# Patient Record
Sex: Female | Born: 1970 | Race: White | Hispanic: No | Marital: Single | Smoking: Current every day smoker
Health system: Southern US, Community
[De-identification: ages and names within clinical notes are randomized; demographics above are authoritative.]

## PROBLEM LIST (undated history)

## (undated) ENCOUNTER — Emergency Department (HOSPITAL_BASED_OUTPATIENT_CLINIC_OR_DEPARTMENT_OTHER): Discharge status: Against Medical Advice | Payer: 59

## (undated) DIAGNOSIS — F419 Anxiety disorder, unspecified: Secondary | ICD-10-CM

## (undated) DIAGNOSIS — M542 Cervicalgia: Secondary | ICD-10-CM

## (undated) DIAGNOSIS — M76892 Other specified enthesopathies of left lower limb, excluding foot: Secondary | ICD-10-CM

## (undated) HISTORY — PX: OTHER SURGICAL HISTORY: SHX169

## (undated) HISTORY — PX: BREAST REDUCTION SURGERY: SHX8

---

## 1998-07-26 ENCOUNTER — Other Ambulatory Visit: Admission: RE | Admit: 1998-07-26 | Discharge: 1998-07-26 | Payer: Self-pay | Admitting: Family Medicine

## 2000-01-09 ENCOUNTER — Other Ambulatory Visit: Admission: RE | Admit: 2000-01-09 | Discharge: 2000-01-09 | Payer: Self-pay | Admitting: Emergency Medicine

## 2012-03-31 ENCOUNTER — Encounter (HOSPITAL_BASED_OUTPATIENT_CLINIC_OR_DEPARTMENT_OTHER): Payer: Self-pay | Admitting: *Deleted

## 2012-03-31 ENCOUNTER — Emergency Department (HOSPITAL_BASED_OUTPATIENT_CLINIC_OR_DEPARTMENT_OTHER)
Admission: EM | Admit: 2012-03-31 | Discharge: 2012-04-01 | Disposition: A | Discharge status: Home / Self Care | Payer: 59 | Attending: Emergency Medicine | Admitting: Emergency Medicine

## 2012-03-31 ENCOUNTER — Emergency Department (HOSPITAL_BASED_OUTPATIENT_CLINIC_OR_DEPARTMENT_OTHER): Payer: 59

## 2012-03-31 DIAGNOSIS — IMO0001 Reserved for inherently not codable concepts without codable children: Secondary | ICD-10-CM | POA: Insufficient documentation

## 2012-03-31 DIAGNOSIS — S61209A Unspecified open wound of unspecified finger without damage to nail, initial encounter: Secondary | ICD-10-CM | POA: Insufficient documentation

## 2012-03-31 DIAGNOSIS — Y92009 Unspecified place in unspecified non-institutional (private) residence as the place of occurrence of the external cause: Secondary | ICD-10-CM | POA: Insufficient documentation

## 2012-03-31 DIAGNOSIS — L0390 Cellulitis, unspecified: Secondary | ICD-10-CM

## 2012-03-31 DIAGNOSIS — W5501XA Bitten by cat, initial encounter: Secondary | ICD-10-CM

## 2012-03-31 DIAGNOSIS — L02519 Cutaneous abscess of unspecified hand: Secondary | ICD-10-CM | POA: Insufficient documentation

## 2012-03-31 DIAGNOSIS — L03019 Cellulitis of unspecified finger: Secondary | ICD-10-CM | POA: Insufficient documentation

## 2012-03-31 HISTORY — DX: Anxiety disorder, unspecified: F41.9

## 2012-03-31 HISTORY — DX: Cervicalgia: M54.2

## 2012-03-31 MED ORDER — SODIUM CHLORIDE 0.9 % IV SOLN
3.0000 g | Freq: Once | INTRAVENOUS | Status: AC
  Administered 2012-04-01: 3 g via INTRAVENOUS
  Filled 2012-03-31: qty 3

## 2012-03-31 MED ORDER — TETANUS-DIPHTH-ACELL PERTUSSIS 5-2.5-18.5 LF-MCG/0.5 IM SUSP
0.5000 mL | Freq: Once | INTRAMUSCULAR | Status: AC
  Administered 2012-04-01: 0.5 mL via INTRAMUSCULAR
  Filled 2012-03-31: qty 0.5

## 2012-03-31 NOTE — ED Notes (Signed)
Pt reports she was bitten by a cat last night on right hand- swelling and red streaks noted

## 2012-03-31 NOTE — ED Provider Notes (Addendum)
History   This chart was scribed for Alexa Bucco, MD by Shari Heritage. The patient was seen in room MH08/MH08. Patient's care was started at 2233.     CSN: [id number]  Arrival date & time 03/31/12  2233   First MD Initiated Contact with Patient 03/31/12 2324      Chief Complaint  Patient presents with  . Animal Bite    (Consider location/radiation/quality/duration/timing/severity/associated sxs/prior treatment) The history is provided by the patient. No language interpreter was used.   Likisha Hughes is a 41 y.o. female who presents to the Emergency Department complaining of an animal bite to the right hand that occurred last night with associated redness and swelling. Today, the redness started spreading up her arm.  Patient's friend's cat bit her, and she says that the cat is an indoor cat and it's vaccines are UTD.  Patient is right-handed. Patient said she applied hydrogen peroxide to the bite. Patient took Ibuprofen this morning. Patient thinks she had a tetanus shot in 2007. Patient denies fever, nausea, vomiting, cough, abdominal pain. Patient denies any other symptoms. Patient with h/o of neck pain, anxiety, and arm surgery.   Past Medical History  Diagnosis Date  . Neck pain   . Anxiety     Past Surgical History  Procedure Date  . Breast reduction surgery   . Cesarean section   . Arm surgery     No family history on file.  History  Substance Use Topics  . Smoking status: Current Everyday Smoker  . Smokeless tobacco: Never Used  . Alcohol Use: Not on file    OB History    Grav Para Term Preterm Abortions TAB SAB Ect Mult Living                  Review of Systems A complete 10 system review of systems was obtained and all systems are negative except as noted in the HPI and PMH.   Allergies  Review of patient's allergies indicates no known allergies.  Home Medications   Current Outpatient Rx  Name Route Sig Dispense Refill  . FLUOXETINE HCL 20 MG PO  CAPS Oral Take 20 mg by mouth daily.    . IBUPROFEN 200 MG PO TABS Oral Take 400 mg by mouth every 6 (six) hours as needed. Patient used this medication for pain.    Marland Kitchen OMEPRAZOLE 40 MG PO CPDR Oral Take 40 mg by mouth daily.    . AMOXICILLIN-POT CLAVULANATE 875-125 MG PO TABS Oral Take 1 tablet by mouth every 12 (twelve) hours. 14 tablet 0  . HYDROCODONE-ACETAMINOPHEN 5-500 MG PO TABS Oral Take 1-2 tablets by mouth every 6 (six) hours as needed for pain. 15 tablet 0    BP 141/82  Pulse 81  Temp(Src) 98.2 F (36.8 C) (Oral)  Resp 18  Ht 5\' 2"  (1.575 m)  Wt 146 lb (66.225 kg)  BMI 26.70 kg/m2  SpO2 100%  LMP 03/27/2012  Physical Exam  Nursing note and vitals reviewed. Constitutional: She is oriented to person, place, and time. She appears well-developed and well-nourished.  HENT:  Head: Normocephalic and atraumatic.  Eyes: Conjunctivae and EOM are normal. Pupils are equal, round, and reactive to light.  Neck: Normal range of motion. Neck supple.  Cardiovascular: Normal rate and regular rhythm.   Pulmonary/Chest: Effort normal and breath sounds normal.  Abdominal: Soft. Bowel sounds are normal.  Musculoskeletal: Normal range of motion.       Diffuse swelling to right thumb with erythema.  Red streak extending to mid-forearm. No induration or fluctuance. No pain with passive ROM in thumb. No drainage.  Neurological: She is alert and oriented to person, place, and time.  Skin: Skin is warm and dry.  Psychiatric: She has a normal mood and affect.    ED Course  Procedures (including critical care time) DIAGNOSTIC STUDIES: Oxygen Saturation is 100% on room air, normal by my interpretation.    COORDINATION OF CARE: 11:38PM-  Patient informed of current plan for treatment and evaluation and agrees with plan at this time.  Will order X-Ray of hand. Will administer IV antibiotics and Ibuprofen in ED. Will prescribe oral antibiotics. Will administer TDAP vaccine.  Labs Reviewed - No  data to display Dg Finger Thumb Right  04/01/2012  *RADIOLOGY REPORT*  Clinical Data: Thumb bite by cat.  Thumb pain and swelling.  RIGHT THUMB 2+V  Comparison:  None.  Findings:  There is no evidence of fracture or dislocation.  There is no evidence of arthropathy or other focal bone abnormality. Soft tissues are unremarkable.  IMPRESSION:  Negative.  Original Report Authenticated By: Danae Orleans, M.D.     1. Cat bite   2. Cellulitis       MDM  Pt with cellulitis related to cat bite to thumb.  No evidence of abscess or tenosynovitis.  Given dose of IV abx, TDAP updated.  Animal vaccines UTD.  Will bring pt back tomorrow for a recheck.      I personally performed the services described in this documentation, which was scribed in my presence.  The recorded information has been reviewed and considered.    Alexa Bucco, MD 04/01/12 1610  Alexa Bucco, MD 04/01/12 [phone]

## 2012-04-01 MED ORDER — HYDROCODONE-ACETAMINOPHEN 5-500 MG PO TABS: 1.0000 | ORAL_TABLET | Freq: Four times a day (QID) | ORAL | Status: AC | PRN

## 2012-04-01 MED ORDER — FLUCONAZOLE 200 MG PO TABS: 200.0000 mg | ORAL_TABLET | Freq: Every day | ORAL | Status: AC

## 2012-04-01 MED ORDER — AMOXICILLIN-POT CLAVULANATE 875-125 MG PO TABS: 1.0000 | ORAL_TABLET | Freq: Two times a day (BID) | ORAL | Status: AC

## 2012-04-01 MED ORDER — IBUPROFEN 800 MG PO TABS
800.0000 mg | ORAL_TABLET | Freq: Once | ORAL | Status: AC
  Administered 2012-04-01: 800 mg via ORAL
  Filled 2012-04-01: qty 1

## 2012-04-01 NOTE — Discharge Instructions (Signed)
Cellulitis  Cellulitis is an infection of the skin and the tissue beneath it. The area is typically red and tender. It is caused by germs (bacteria) (usually staph or strep) that enter the body through cuts or sores. Cellulitis most commonly occurs in the arms or lower legs.   HOME CARE INSTRUCTIONS    If you are given a prescription for medications which kill germs (antibiotics), take as directed until finished.   If the infection is on the arm or leg, keep the limb elevated as able.   Use a warm cloth several times per day to relieve pain and encourage healing.   See your caregiver for recheck of the infected site as directed if problems arise.   Only take over-the-counter or prescription medicines for pain, discomfort, or fever as directed by your caregiver.  SEEK MEDICAL CARE IF:    The area of redness (inflammation) is spreading, there are red streaks coming from the infected site, or if a part of the infection begins to turn dark in color.   The joint or bone underneath the infected skin becomes painful after the skin has healed.   The infection returns in the same or another area after it seems to have gone away.   A boil or bump swells up. This may be an abscess.   New, unexplained problems such as pain or fever develop.  SEEK IMMEDIATE MEDICAL CARE IF:    You have a fever.   You or your child feels drowsy or lethargic.   There is vomiting, diarrhea, or lasting discomfort or feeling ill (malaise) with muscle aches and pains.  MAKE SURE YOU:    Understand these instructions.   Will watch your condition.   Will get help right away if you are not doing well or get worse.  Document Released: 08/03/2005 Document Revised: 10/13/2011 Document Reviewed: 06/11/2008  [employer] Patient Information 2012 [employer]  Animal Bite  An animal bite can result in a scratch on the skin, deep open cut, puncture of the skin, crush injury, or tearing away of the skin or a body part. Dogs are responsible for  most animal bites. Children are bitten more often than adults. An animal bite can range from very mild to more serious. A small bite from your house pet is no cause for alarm. However, some animal bites can become infected or injure a bone or other tissue. You must seek medical care if:   The skin is broken and bleeding does not slow down or stop after 15 minutes.   The puncture is deep and difficult to clean (such as a cat bite).   Pain, warmth, redness, or pus develops around the wound.   The bite is from a stray animal or rodent. There may be a risk of rabies infection.   The bite is from a snake, raccoon, skunk, fox, coyote, or bat. There may be a risk of rabies infection.   The person bitten has a chronic illness such as diabetes, liver disease, or cancer, or the person takes medicine that lowers the immune system.   There is concern about the location and severity of the bite.  It is important to clean and protect an animal bite wound right away to prevent infection. Follow these steps:   Clean the wound with plenty of water and soap.   Apply an antibiotic cream.   Apply gentle pressure over the wound with a clean towel or gauze to slow or stop bleeding.     Elevate the affected area above the heart to help stop any bleeding.   Seek medical care. Getting medical care within 8 hours of the animal bite leads to the best possible outcome.  DIAGNOSIS   Your caregiver will most likely:   Take a detailed history of the animal and the bite injury.   Perform a wound exam.   Take your medical history.  Blood tests or X-rays may be performed. Sometimes, infected bite wounds are cultured and sent to a lab to identify the infectious bacteria.   TREATMENT   Medical treatment will depend on the location and type of animal bite as well as the patient's medical history. Treatment may include:   Wound care, such as cleaning and flushing the wound with saline solution, bandaging, and elevating the affected  area.   Antibiotics.   Tetanus immunization.   Rabies immunization.   Leaving the wound open to heal. This is often done with animal bites, due to the high risk of infection. However, in certain cases, wound closure with stitches, wound adhesive, skin adhesive strips, or staples may be used.  Infected bites that are left untreated may require intravenous (IV) antibiotics and surgical treatment in the hospital.  HOME CARE INSTRUCTIONS   Follow your caregiver's instructions for wound care.   Take all medicines as directed.   If your caregiver prescribes antibiotics, take them as directed. Finish them even if you start to feel better.   Follow up with your caregiver for further exams or immunizations as directed.  You may need a tetanus shot if:   You cannot remember when you had your last tetanus shot.   You have never had a tetanus shot.   The injury broke your skin.  If you get a tetanus shot, your arm may swell, get red, and feel warm to the touch. This is common and not a problem. If you need a tetanus shot and you choose not to have one, there is a rare chance of getting tetanus. Sickness from tetanus can be serious.  SEEK MEDICAL CARE IF:   You notice warmth, redness, soreness, swelling, pus discharge, or a bad smell coming from the wound.   You have a red line on the skin coming from the wound.   You have a fever, chills, or a general ill feeling.   You have nausea or vomiting.   You have continued or worsening pain.   You have trouble moving the injured part.   You have other questions or concerns.  MAKE SURE YOU:   Understand these instructions.   Will watch your condition.   Will get help right away if you are not doing well or get worse.  Document Released: 07/12/2011 Document Revised: 10/13/2011 Document Reviewed: 07/12/2011  [employer] Patient Information 2012 [employer]

## 2012-04-01 NOTE — ED Notes (Signed)
rx x 1 given for hydrocodone- rx x 2 for augmentin and dilflucan e-prescribed

## 2012-05-11 ENCOUNTER — Emergency Department (HOSPITAL_COMMUNITY)
Admission: EM | Admit: 2012-05-11 | Discharge: 2012-05-11 | Disposition: A | Discharge status: Home / Self Care | Payer: 59 | Attending: Emergency Medicine | Admitting: Emergency Medicine

## 2012-05-11 ENCOUNTER — Encounter (HOSPITAL_COMMUNITY): Payer: Self-pay | Admitting: Emergency Medicine

## 2012-05-11 DIAGNOSIS — F411 Generalized anxiety disorder: Secondary | ICD-10-CM | POA: Insufficient documentation

## 2012-05-11 DIAGNOSIS — F17200 Nicotine dependence, unspecified, uncomplicated: Secondary | ICD-10-CM | POA: Insufficient documentation

## 2012-05-11 DIAGNOSIS — X58XXXA Exposure to other specified factors, initial encounter: Secondary | ICD-10-CM | POA: Insufficient documentation

## 2012-05-11 DIAGNOSIS — S8990XA Unspecified injury of unspecified lower leg, initial encounter: Secondary | ICD-10-CM | POA: Insufficient documentation

## 2012-05-11 HISTORY — DX: Other specified enthesopathies of left lower limb, excluding foot: M76.892

## 2012-05-11 MED ORDER — KETOROLAC TROMETHAMINE 30 MG/ML IJ SOLN: 30.0000 mg | Freq: Once | INTRAMUSCULAR | Status: DC

## 2012-05-11 MED ORDER — OXYCODONE-ACETAMINOPHEN 5-325 MG PO TABS: 2.0000 | ORAL_TABLET | ORAL | Status: AC | PRN

## 2012-05-11 NOTE — ED Provider Notes (Signed)
History     CSN: [id number]  Arrival date & time 05/11/12  0913   First MD Initiated Contact with Patient 05/11/12 [phone]      Chief Complaint  Patient presents with  . Knee Pain    (Consider location/radiation/quality/duration/timing/severity/associated sxs/prior treatment) Patient is a 41 y.o. female presenting with knee pain. The history is provided by the patient.  Knee Pain  pt was looking under the bed and felt and heard a "pop" in her left knee.  She did NOT fall or have trauma.   She denies pain or prior left knee injury.  She has no other complaints.  Past Medical History  Diagnosis Date  . Neck pain   . Anxiety   . Tendonitis of knee, left     Past Surgical History  Procedure Date  . Breast reduction surgery   . Cesarean section   . Arm surgery     No family history on file.  History  Substance Use Topics  . Smoking status: Current Everyday Smoker  . Smokeless tobacco: Never Used  . Alcohol Use: 1.8 oz/week    3 Glasses of wine per week    OB History    Grav Para Term Preterm Abortions TAB SAB Ect Mult Living                  Review of Systems  Musculoskeletal:       Left knee pain  Skin: Negative for wound.  Neurological: Negative for weakness.  Hematological: Does not bruise/bleed easily.  All other systems reviewed and are negative.    Allergies  Review of patient's allergies indicates no known allergies.  Home Medications   Current Outpatient Rx  Name Route Sig Dispense Refill  . FLUOXETINE HCL 20 MG PO CAPS Oral Take 20 mg by mouth daily.    . IBUPROFEN 200 MG PO TABS Oral Take 400 mg by mouth every 6 (six) hours as needed. For pain    . OMEPRAZOLE 40 MG PO CPDR Oral Take 40 mg by mouth daily.    . OXYCODONE-ACETAMINOPHEN 5-325 MG PO TABS Oral Take 2 tablets by mouth every 4 (four) hours as needed for pain. 15 tablet 0    BP 127/72  Pulse 62  Temp 98.7 F (37.1 C) (Oral)  Resp 18  SpO2 96%  LMP 05/02/2012  Physical Exam    Nursing note and vitals reviewed. Constitutional: She is oriented to person, place, and time. She appears well-developed and well-nourished.  HENT:  Head: Normocephalic and atraumatic.  Eyes: EOM are normal.  Neck: Normal range of motion.  Pulmonary/Chest: Effort normal.  Abdominal: She exhibits no distension.  Musculoskeletal: Normal range of motion. She exhibits tenderness. She exhibits no edema.       Left knee No deformity, edema, ecchymoses + ttp over medial knee overlying MCL Neg ant drawer or sag Joint does not open with medial or lateral stress.   From both active and passively Pt is able to extend knee on her own.  Neurological: She is alert and oriented to person, place, and time.  Skin: Skin is warm and dry.  Psychiatric: She has a normal mood and affect. Thought content normal.    ED Course  Procedures (including critical care time)  Labs Reviewed - No data to display No results found.   1. Knee injury       MDM  Medial left knee pain Probable MCL strain.  No internal ligamentous injury. No direct trauma so no  fx or dislocation        Cheri Guppy, MD 05/11/12 1017

## 2012-05-11 NOTE — ED Notes (Signed)
[id number]<AV> Expected date:05/11/12<BR> Expected time: 8:51 AM<BR> Means of arrival:Ambulance<BR> Comments:<BR> M11,40yoF, knee pain

## 2012-05-11 NOTE — ED Notes (Signed)
Quentin from ortho notified regarding pt knee immobilizer.

## 2012-05-11 NOTE — ED Notes (Signed)
Per EMS, pt was working this am, looking under a bed and twisted her knee. Per EMS pt reports feeling something "pop".

## 2012-05-11 NOTE — ED Notes (Addendum)
Pt states she was on her knees, knelling down and she twisted her left knee this am, while at work.  States pain is 8/10 during movement.

## 2019-10-02 ENCOUNTER — Emergency Department (HOSPITAL_COMMUNITY)
Admission: EM | Admit: 2019-10-02 | Discharge: 2019-10-03 | Disposition: A | Discharge status: Home / Self Care | Payer: 59 | Attending: Emergency Medicine | Admitting: Emergency Medicine

## 2019-10-02 ENCOUNTER — Other Ambulatory Visit: Payer: Self-pay

## 2019-10-02 ENCOUNTER — Encounter (HOSPITAL_COMMUNITY): Payer: Self-pay | Admitting: Emergency Medicine

## 2019-10-02 ENCOUNTER — Emergency Department (HOSPITAL_COMMUNITY): Payer: 59

## 2019-10-02 DIAGNOSIS — Z20828 Contact with and (suspected) exposure to other viral communicable diseases: Secondary | ICD-10-CM | POA: Insufficient documentation

## 2019-10-02 DIAGNOSIS — Z79899 Other long term (current) drug therapy: Secondary | ICD-10-CM | POA: Diagnosis not present

## 2019-10-02 DIAGNOSIS — F17200 Nicotine dependence, unspecified, uncomplicated: Secondary | ICD-10-CM | POA: Diagnosis not present

## 2019-10-02 DIAGNOSIS — H9311 Tinnitus, right ear: Secondary | ICD-10-CM | POA: Diagnosis not present

## 2019-10-02 DIAGNOSIS — R42 Dizziness and giddiness: Secondary | ICD-10-CM | POA: Diagnosis not present

## 2019-10-02 DIAGNOSIS — R519 Headache, unspecified: Secondary | ICD-10-CM | POA: Diagnosis not present

## 2019-10-02 DIAGNOSIS — R209 Unspecified disturbances of skin sensation: Secondary | ICD-10-CM | POA: Diagnosis not present

## 2019-10-02 DIAGNOSIS — R202 Paresthesia of skin: Secondary | ICD-10-CM | POA: Insufficient documentation

## 2019-10-02 LAB — CBC
HCT: 42.1 % (ref 36.0–46.0)
Hemoglobin: 14.2 g/dL (ref 12.0–15.0)
MCH: 30.5 pg (ref 26.0–34.0)
MCHC: 33.7 g/dL (ref 30.0–36.0)
MCV: 90.3 fL (ref 80.0–100.0)
Platelets: 270 10*3/uL (ref 150–400)
RBC: 4.66 MIL/uL (ref 3.87–5.11)
RDW: 12.3 % (ref 11.5–15.5)
WBC: 7.1 10*3/uL (ref 4.0–10.5)
nRBC: 0 % (ref 0.0–0.2)

## 2019-10-02 LAB — I-STAT BETA HCG BLOOD, ED (MC, WL, AP ONLY): I-stat hCG, quantitative: <5 m[IU]/mL (ref <5)

## 2019-10-02 LAB — URINALYSIS, ROUTINE W REFLEX MICROSCOPIC
Bilirubin Urine: NEGATIVE
Glucose, UA: NEGATIVE mg/dL
Hgb urine dipstick: NEGATIVE
Ketones, ur: NEGATIVE mg/dL
Leukocytes,Ua: NEGATIVE
Nitrite: NEGATIVE
Protein, ur: NEGATIVE mg/dL
Specific Gravity, Urine: 1.003 — ABNORMAL LOW (ref 1.005–1.030)
pH: 6 (ref 5.0–8.0)

## 2019-10-02 LAB — BASIC METABOLIC PANEL
Anion gap: 8 (ref 5–15)
BUN: 8 mg/dL (ref 6–20)
CO2: 20 mmol/L — ABNORMAL LOW (ref 22–32)
Calcium: 8.5 mg/dL — ABNORMAL LOW (ref 8.9–10.3)
Chloride: 112 mmol/L — ABNORMAL HIGH (ref 98–111)
Creatinine, Ser: 0.71 mg/dL (ref 0.44–1.00)
GFR calc Af Amer: >60 mL/min (ref >60)
GFR calc non Af Amer: >60 mL/min (ref >60)
Glucose, Bld: 81 mg/dL (ref 70–99)
Potassium: 3.8 mmol/L (ref 3.5–5.1)
Sodium: 140 mmol/L (ref 135–145)

## 2019-10-02 LAB — LIPASE, BLOOD: Lipase: 34 U/L (ref 11–51)

## 2019-10-02 MED ORDER — IOHEXOL 350 MG/ML SOLN
80.0000 mL | Freq: Once | INTRAVENOUS | Status: AC | PRN
  Administered 2019-10-02: 80 mL via INTRAVENOUS

## 2019-10-02 MED ORDER — SODIUM CHLORIDE 0.9 % IV BOLUS
1000.0000 mL | Freq: Once | INTRAVENOUS | Status: AC
  Administered 2019-10-02: 21:00:00 1000 mL via INTRAVENOUS

## 2019-10-02 MED ORDER — ONDANSETRON 4 MG PO TBDP
4.0000 mg | ORAL_TABLET | Freq: Once | ORAL | Status: AC
  Administered 2019-10-02: 17:00:00 4 mg via ORAL
  Filled 2019-10-02: qty 1

## 2019-10-02 MED ORDER — MECLIZINE HCL 25 MG PO TABS
25.0000 mg | ORAL_TABLET | Freq: Once | ORAL | Status: AC
  Administered 2019-10-02: 21:00:00 25 mg via ORAL
  Filled 2019-10-02: qty 1

## 2019-10-02 NOTE — ED Provider Notes (Signed)
11:24 PM Assumed care from Dr. Billy Fischer, please see their note for full history, physical and decision making until this point. In brief this is a 48 y.o. year old female who presented to the ED tonight with Dizziness and Nausea     Some mild dizziness/diarrhea then into what sounds like vertigo (peripheral). Then bue paresthesias and some intermittent blurry vision.   Discussed with Neurology. Pending cta and mri. Discharge on meclizine if no cva. If arterial abnormality then 81 mg ASA and follow up.   Significant MRI delay 2/2 pending covid test. Ultimately negative. Plan for dc on meclizine w/ pcp follow up  Discharge instructions, including strict return precautions for new or worsening symptoms, given. Patient and/or family verbalized understanding and agreement with the plan as described.   Labs, studies and imaging reviewed by myself and considered in medical decision making if ordered. Imaging interpreted by radiology.  Labs Reviewed  BASIC METABOLIC PANEL - Abnormal; Notable for the following components:      Result Value   Chloride 112 (*)    CO2 20 (*)    Calcium 8.5 (*)    All other components within normal limits  URINALYSIS, ROUTINE W REFLEX MICROSCOPIC - Abnormal; Notable for the following components:   Color, Urine STRAW (*)    Specific Gravity, Urine 1.003 (*)    All other components within normal limits  SARS CORONAVIRUS 2 (TAT 6-24 HRS)  CBC  LIPASE, BLOOD  CBG MONITORING, ED  I-STAT BETA HCG BLOOD, ED ([facility], [facility], [facility] ONLY)    CT Head Wo Contrast  Final Result    MR BRAIN WO CONTRAST    (Results Pending)  CT Angio Neck W and/or Wo Contrast    (Results Pending)  CT Angio Head W or Wo Contrast    (Results Pending)    No follow-ups on file.    Laikyn Gewirtz, Corene Cornea, MD 10/03/19 [phone]

## 2019-10-02 NOTE — ED Notes (Signed)
Pt reports intermittent left and right arm numbness. Equal grip strength and no arm drift noted.
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
## 2019-10-02 NOTE — ED Notes (Signed)
CBG 168 @1655 

## 2019-10-02 NOTE — ED Notes (Signed)
Patient transported to CT 

## 2019-10-02 NOTE — ED Triage Notes (Signed)
Pt arrives via EMS from work with reports of lightheadedness, nausea and diarrhea starting today. Reports when she closes her eyes, the room spins.

## 2019-10-03 ENCOUNTER — Emergency Department (HOSPITAL_COMMUNITY): Payer: 59

## 2019-10-03 LAB — SARS CORONAVIRUS 2 (TAT 6-24 HRS): SARS Coronavirus 2: NEGATIVE

## 2019-10-03 MED ORDER — MECLIZINE HCL 25 MG PO TABS: 25.0000 mg | ORAL_TABLET | Freq: Three times a day (TID) | ORAL | 0 refills | Status: AC | PRN

## 2019-10-03 NOTE — ED Notes (Signed)
Returned from MRI 

## 2019-12-23 ENCOUNTER — Emergency Department (HOSPITAL_BASED_OUTPATIENT_CLINIC_OR_DEPARTMENT_OTHER): Payer: No Typology Code available for payment source

## 2019-12-23 ENCOUNTER — Encounter (HOSPITAL_BASED_OUTPATIENT_CLINIC_OR_DEPARTMENT_OTHER): Payer: Self-pay | Admitting: *Deleted

## 2019-12-23 ENCOUNTER — Other Ambulatory Visit: Payer: Self-pay

## 2019-12-23 ENCOUNTER — Emergency Department (HOSPITAL_BASED_OUTPATIENT_CLINIC_OR_DEPARTMENT_OTHER)
Admission: EM | Admit: 2019-12-23 | Discharge: 2019-12-23 | Disposition: A | Discharge status: Home / Self Care | Payer: No Typology Code available for payment source | Attending: Emergency Medicine | Admitting: Emergency Medicine

## 2019-12-23 DIAGNOSIS — Y9389 Activity, other specified: Secondary | ICD-10-CM | POA: Insufficient documentation

## 2019-12-23 DIAGNOSIS — F17210 Nicotine dependence, cigarettes, uncomplicated: Secondary | ICD-10-CM | POA: Insufficient documentation

## 2019-12-23 DIAGNOSIS — M79671 Pain in right foot: Secondary | ICD-10-CM | POA: Insufficient documentation

## 2019-12-23 DIAGNOSIS — Z79899 Other long term (current) drug therapy: Secondary | ICD-10-CM | POA: Insufficient documentation

## 2019-12-23 DIAGNOSIS — S99921A Unspecified injury of right foot, initial encounter: Secondary | ICD-10-CM | POA: Diagnosis present

## 2019-12-23 DIAGNOSIS — Y9289 Other specified places as the place of occurrence of the external cause: Secondary | ICD-10-CM | POA: Diagnosis not present

## 2019-12-23 DIAGNOSIS — Y998 Other external cause status: Secondary | ICD-10-CM | POA: Insufficient documentation

## 2019-12-23 NOTE — Discharge Instructions (Signed)
Ice then heat. Can take Tylenol or Ibuprofen for pain. Follow up with PCP or Orthopedics is pain past 5-7 days.
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
## 2021-08-23 IMAGING — CT CT ANGIO HEAD
2 of 7 series · 8 of 33 positions shown · IV contrast (OMNI 350)
Comparison: None.

CLINICAL DATA: vertigo and bilateral arm numbness



[Series 5: cta neck · axial · 0.42mm/px · z∈[-141,-25]mm · 2 of 175 slices shown]
[im 59/175  soft-tissue]
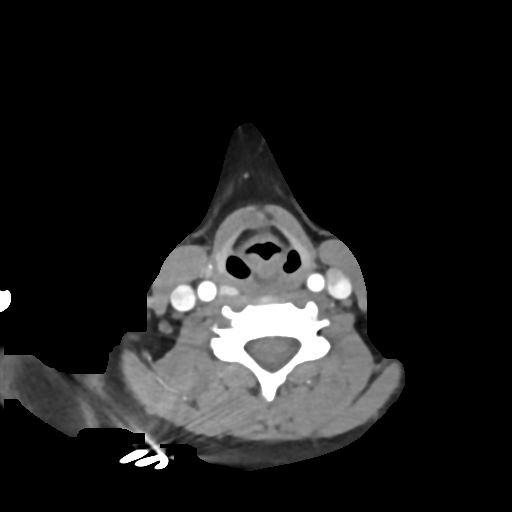
[im 117/175  soft-tissue]
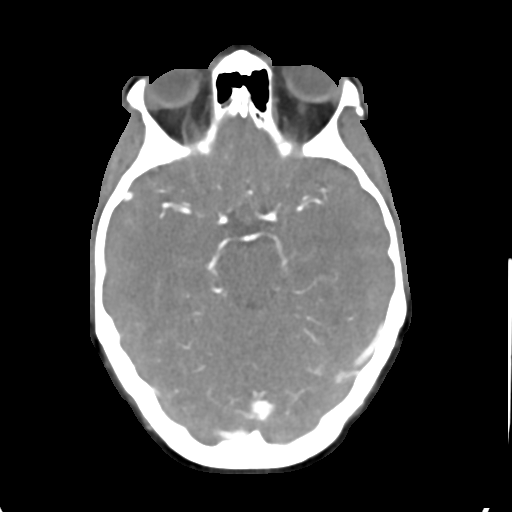

[Series 7: cta neck axial · axial · 0.39mm/px · z∈[-210,+39]mm · 6 of 349 slices shown]
[im 50/349  soft-tissue]
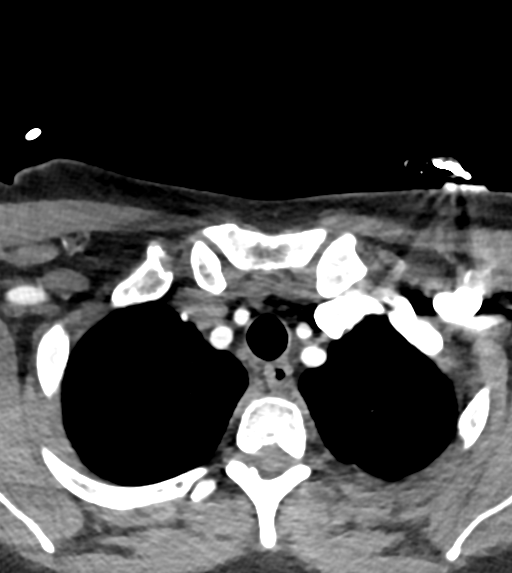
[im 100/349  bone]
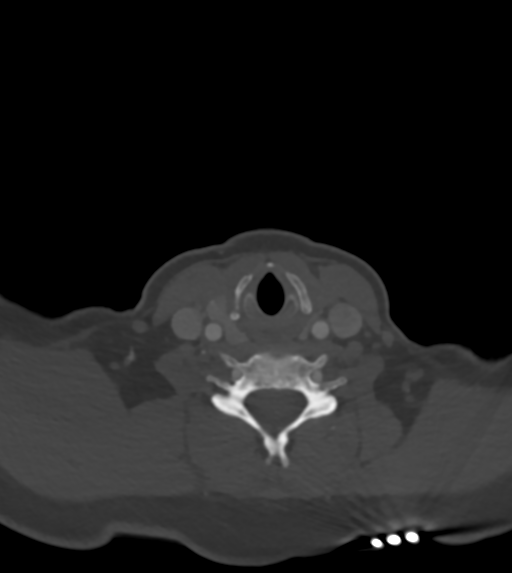
[im 150/349  soft-tissue]
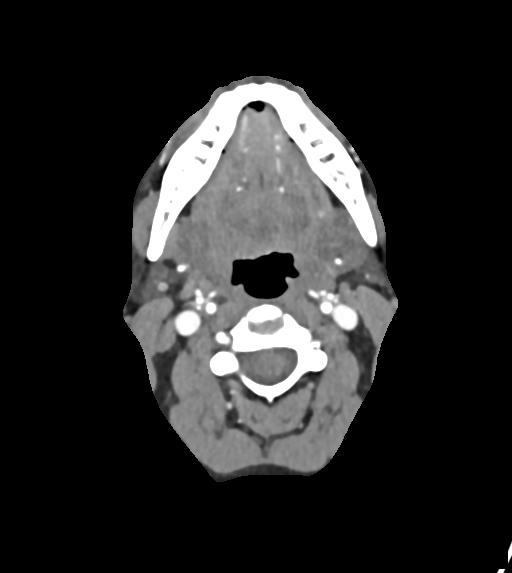
[im 199/349  bone]
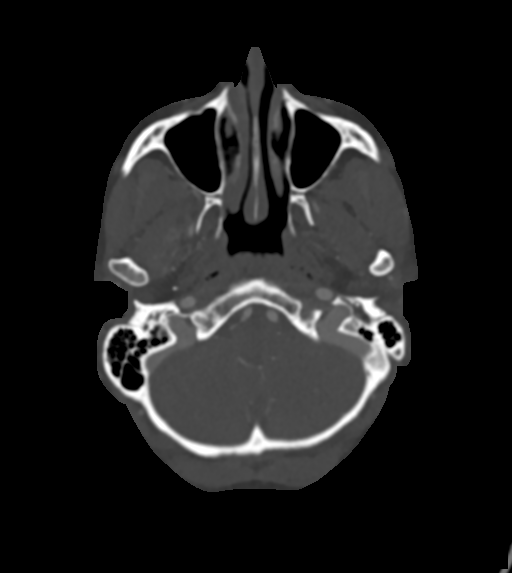
[im 249/349  soft-tissue]
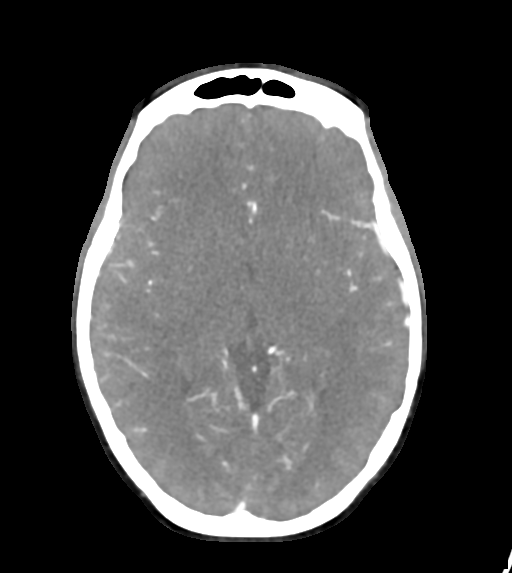
[im 299/349  bone]
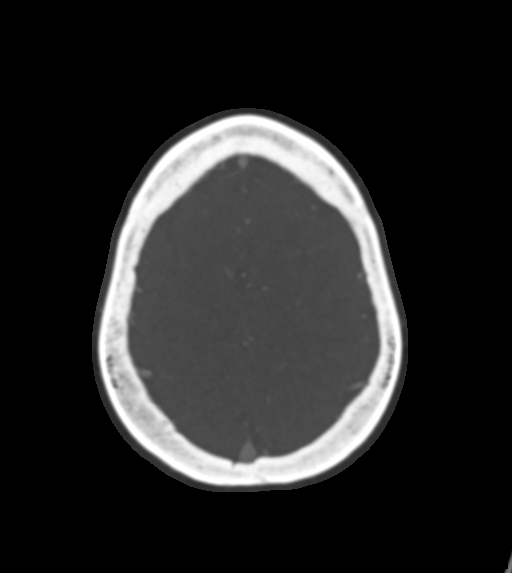

[8 of 33 positions shown; findings below may reference images not displayed]

FINDINGS: CTA NECK FINDINGS

SKELETON: There is no bony spinal canal stenosis. No lytic or
blastic lesion.

OTHER NECK: Normal pharynx, larynx and major salivary glands. No
cervical lymphadenopathy. Unremarkable thyroid gland.

UPPER CHEST: No pneumothorax or pleural effusion. No nodules or
masses.

AORTIC ARCH:

There is no calcific atherosclerosis of the aortic arch. There is no
aneurysm, dissection or hemodynamically significant stenosis of the
visualized portion of the aorta. Conventional 3 vessel aortic
branching pattern. The visualized proximal subclavian arteries are
widely patent.

RIGHT CAROTID SYSTEM: Normal without aneurysm, dissection or
stenosis.

LEFT CAROTID SYSTEM: Normal without aneurysm, dissection or
stenosis.

VERTEBRAL ARTERIES: Codominant configuration. Both origins are
clearly patent. There is no dissection, occlusion or flow-limiting
stenosis to the skull base (V1-V3 segments).

CTA HEAD FINDINGS

POSTERIOR CIRCULATION:

--Vertebral arteries: Normal V4 segments.

--Posterior inferior cerebellar arteries (PICA): Patent origins from
the vertebral arteries.

--Anterior inferior cerebellar arteries (AICA): Patent origins from
the basilar artery.

--Basilar artery: Normal.

--Superior cerebellar arteries: Normal.

--Posterior cerebral arteries: Normal. Both originate from the
basilar artery. Posterior communicating arteries (p-comm) are
diminutive or absent.

ANTERIOR CIRCULATION:

--Intracranial internal carotid arteries: Normal.

--Anterior cerebral arteries (ACA): Normal. Both A1 segments are
present. Patent anterior communicating artery (a-comm).

--Middle cerebral arteries (MCA): Normal.

VENOUS SINUSES: As permitted by contrast timing, patent.

ANATOMIC VARIANTS: None

Review of the MIP images confirms the above findings.
IMPRESSION: Normal CTA of the head and neck.
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
